# Patient Record
Sex: Male | Born: 1990 | Race: White | Hispanic: No | Marital: Single | Smoking: Current every day smoker
Health system: Southern US, Community
[De-identification: ages and names within clinical notes are randomized; demographics above are authoritative.]

---
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
## 2014-07-13 ENCOUNTER — Emergency Department: Payer: Self-pay | Admitting: Emergency Medicine

## 2014-11-07 ENCOUNTER — Emergency Department: Payer: Self-pay | Admitting: Student

## 2014-11-21 ENCOUNTER — Emergency Department
Admit: 2014-11-21 | Disposition: A | Discharge status: Home / Self Care | Payer: Self-pay | Admitting: Emergency Medicine

## 2014-11-21 LAB — COMPREHENSIVE METABOLIC PANEL
ALT: 16 U/L — AB
Albumin: 4.6 g/dL
Alkaline Phosphatase: 67 U/L
Anion Gap: 8 (ref 7–16)
BUN: 16 mg/dL
Bilirubin,Total: 0.5 mg/dL
CALCIUM: 9.3 mg/dL
Chloride: 105 mmol/L
Co2: 27 mmol/L
Creatinine: 1.11 mg/dL
EGFR (African American): >60
Glucose: 114 mg/dL — ABNORMAL HIGH
Potassium: 3.4 mmol/L — ABNORMAL LOW
SGOT(AST): 22 U/L
SODIUM: 140 mmol/L
TOTAL PROTEIN: 8 g/dL

## 2014-11-21 LAB — URINALYSIS, COMPLETE
BLOOD: NEGATIVE
Bacteria: NONE SEEN
Bilirubin,UR: NEGATIVE
Glucose,UR: NEGATIVE mg/dL (ref 0–75)
Hyaline Cast: 2
KETONE: NEGATIVE
Leukocyte Esterase: NEGATIVE
NITRITE: NEGATIVE
Ph: 5 (ref 4.5–8.0)
RBC,UR: 1 /HPF (ref 0–5)
Specific Gravity: 1.03 (ref 1.003–1.030)
Squamous Epithelial: NONE SEEN

## 2014-11-21 LAB — CBC
HCT: 47.1 % (ref 40.0–52.0)
HGB: 15.9 g/dL (ref 13.0–18.0)
MCH: 30 pg (ref 26.0–34.0)
MCHC: 33.7 g/dL (ref 32.0–36.0)
MCV: 89 fL (ref 80–100)
Platelet: 168 10*3/uL (ref 150–440)
RBC: 5.3 10*6/uL (ref 4.40–5.90)
RDW: 13.2 % (ref 11.5–14.5)
WBC: 10.5 10*3/uL (ref 3.8–10.6)

## 2014-11-21 LAB — DRUG SCREEN, URINE
Amphetamines, Ur Screen: NEGATIVE
Barbiturates, Ur Screen: NEGATIVE
Benzodiazepine, Ur Scrn: NEGATIVE
Cannabinoid 50 Ng, Ur ~~LOC~~: NEGATIVE
Cocaine Metabolite,Ur ~~LOC~~: NEGATIVE
MDMA (Ecstasy)Ur Screen: NEGATIVE
Methadone, Ur Screen: NEGATIVE
OPIATE, UR SCREEN: POSITIVE
Phencyclidine (PCP) Ur S: NEGATIVE
TRICYCLIC, UR SCREEN: NEGATIVE

## 2014-11-21 LAB — ACETAMINOPHEN LEVEL: Acetaminophen: <10 ug/mL

## 2014-11-21 LAB — SALICYLATE LEVEL: Salicylates, Serum: <4 mg/dL

## 2014-11-21 LAB — ETHANOL: Ethanol: <5 mg/dL

## 2015-01-12 ENCOUNTER — Emergency Department
Admission: EM | Admit: 2015-01-12 | Discharge: 2015-01-12 | Disposition: A | Discharge status: Home / Self Care | Payer: PRIVATE HEALTH INSURANCE | Attending: Emergency Medicine | Admitting: Emergency Medicine

## 2015-01-12 ENCOUNTER — Emergency Department: Payer: PRIVATE HEALTH INSURANCE

## 2015-01-12 ENCOUNTER — Encounter: Payer: Self-pay | Admitting: Emergency Medicine

## 2015-01-12 DIAGNOSIS — Y9389 Activity, other specified: Secondary | ICD-10-CM | POA: Diagnosis not present

## 2015-01-12 DIAGNOSIS — W228XXA Striking against or struck by other objects, initial encounter: Secondary | ICD-10-CM | POA: Diagnosis not present

## 2015-01-12 DIAGNOSIS — Y9289 Other specified places as the place of occurrence of the external cause: Secondary | ICD-10-CM | POA: Diagnosis not present

## 2015-01-12 DIAGNOSIS — S92421A Displaced fracture of distal phalanx of right great toe, initial encounter for closed fracture: Secondary | ICD-10-CM | POA: Insufficient documentation

## 2015-01-12 DIAGNOSIS — Y998 Other external cause status: Secondary | ICD-10-CM | POA: Insufficient documentation

## 2015-01-12 DIAGNOSIS — R269 Unspecified abnormalities of gait and mobility: Secondary | ICD-10-CM | POA: Diagnosis not present

## 2015-01-12 DIAGNOSIS — S92401A Displaced unspecified fracture of right great toe, initial encounter for closed fracture: Secondary | ICD-10-CM

## 2015-01-12 DIAGNOSIS — S90111A Contusion of right great toe without damage to nail, initial encounter: Secondary | ICD-10-CM | POA: Insufficient documentation

## 2015-01-12 DIAGNOSIS — S99922A Unspecified injury of left foot, initial encounter: Secondary | ICD-10-CM | POA: Diagnosis present

## 2015-01-12 MED ORDER — HYDROCODONE-ACETAMINOPHEN 5-325 MG PO TABS
1.0000 | ORAL_TABLET | ORAL | Status: AC
  Administered 2015-01-12: 1 via ORAL

## 2015-01-12 MED ORDER — TRAMADOL HCL 50 MG PO TABS: 50.0000 mg | ORAL_TABLET | Freq: Four times a day (QID) | ORAL | Status: DC | PRN

## 2015-01-12 MED ORDER — HYDROCODONE-ACETAMINOPHEN 5-325 MG PO TABS
ORAL_TABLET | ORAL | Status: AC
  Filled 2015-01-12: qty 1

## 2015-01-12 MED ORDER — NAPROXEN 500 MG PO TABS: 500.0000 mg | ORAL_TABLET | Freq: Two times a day (BID) | ORAL | Status: AC

## 2015-01-12 NOTE — ED Provider Notes (Signed)
CSN: [id number]     Arrival date & time 01/12/15  1915 History   First MD Initiated Contact with Patient 01/12/15 1924     Chief Complaint  Patient presents with  . Foot Injury    was kicking at something hit metal post, approx 4 hours ago     (Consider location/radiation/quality/duration/timing/severity/associated sxs/prior Treatment) HPI  24 year old male presents to the emergency department for injury to right great toe. Approximately 4 hours ago patient kicked a metal bed frame accidentally. Patient was wearing shoes. He had mild to moderate pain to the right great toe. Pain has progressively gotten worse. He is able to ambulate but with antalgic component. He denies any ankle or knee pain. Pain is described as a throbbing sensation that has improved with elevation.   History reviewed. No pertinent past medical history. No past surgical history on file. No family history on file. History  Substance Use Topics  . Smoking status: Not on file  . Smokeless tobacco: Not on file  . Alcohol Use: Not on file    Review of Systems  Constitutional: Negative.  Negative for fever, chills, activity change and appetite change.  HENT: Negative for congestion, ear pain, mouth sores, rhinorrhea, sinus pressure, sore throat and trouble swallowing.   Eyes: Negative for photophobia, pain and discharge.  Respiratory: Negative for cough, chest tightness and shortness of breath.   Cardiovascular: Negative for chest pain and leg swelling.  Gastrointestinal: Negative for nausea, vomiting, abdominal pain, diarrhea and abdominal distention.  Genitourinary: Negative for dysuria and difficulty urinating.  Musculoskeletal: Positive for joint swelling, arthralgias and gait problem. Negative for back pain.  Skin: Negative for color change and rash.  Neurological: Negative for dizziness and headaches.  Hematological: Negative for adenopathy.  Psychiatric/Behavioral: Negative for behavioral problems and  agitation.      Allergies  Review of patient's allergies indicates no known allergies.  Home Medications   Prior to Admission medications   Medication Sig Start Date End Date Taking? Authorizing Provider  naproxen (NAPROSYN) 500 MG tablet Take 1 tablet (500 mg total) by mouth 2 (two) times daily with a meal. 01/12/15   Evon Slackhomas C Gaines, PA-C  traMADol (ULTRAM) 50 MG tablet Take 1 tablet (50 mg total) by mouth every 6 (six) hours as needed. 01/12/15   Evon Slackhomas C Gaines, PA-C   BP 115/64 mmHg  Pulse 100  Temp(Src) 98.5 F (36.9 C) (Oral)  Resp 20  Ht 6' (1.829 m)  Wt 153 lb (69.4 kg)  BMI 20.75 kg/m2  SpO2 99% Physical Exam  Constitutional: He is oriented to person, place, and time. He appears well-developed and well-nourished.  HENT:  Head: Normocephalic and atraumatic.  Eyes: Conjunctivae and EOM are normal.  Neck: Normal range of motion. Neck supple.  Cardiovascular: Normal rate.   Pulmonary/Chest: Effort normal and breath sounds normal. No respiratory distress.  Musculoskeletal:       Right foot: There is tenderness, bony tenderness and swelling. There is normal range of motion, normal capillary refill, no deformity and no laceration.       Feet:  Neurological: He is alert and oriented to person, place, and time.  Skin: Skin is warm and dry.  Psychiatric: He has a normal mood and affect. His behavior is normal. Judgment and thought content normal.    ED Course  Procedures (including critical care time) Labs Review Labs Reviewed - No data to display  Imaging Review Dg Foot Complete Right  01/12/2015   CLINICAL DATA:  Trauma/  injury to 1st digit  EXAM: RIGHT FOOT COMPLETE - 3+ VIEW  COMPARISON:  None.  FINDINGS: Comminuted fracture involving the medial base of the 1st distal phalanx. Associated intra-articular extension.  The joint spaces are preserved.  Mild soft tissue swelling.  IMPRESSION: Comminuted intra-articular fracture involving the 1st distal phalanx, as above.    Electronically Signed   By: Charline Bills M.D.   On: 01/12/2015 19:53     EKG Interpretation None      MDM   Final diagnoses:  Contusion of right great toe without damage to nail, initial encounter  Fractured great toe, right, closed, initial encounter    Patient was given postop shoe, crutches, prescription for naproxen, tramadol. Follow up with orthopedics in 5-7 days. Stay nonweightbearing.    Evon Slack, PA-C 01/12/15 2001  Sharman Cheek, MD 01/12/15 [id number]

## 2015-01-12 NOTE — Discharge Instructions (Signed)
Blunt Trauma °You have been evaluated for injuries. You have been examined and your caregiver has not found injuries serious enough to require hospitalization. °It is common to have multiple bruises and sore muscles following an accident. These tend to feel worse for the first 24 hours. You will feel more stiffness and soreness over the next several hours and worse when you wake up the first morning after your accident. After this point, you should begin to improve with each passing day. The amount of improvement depends on the amount of damage done in the accident. °Following your accident, if some part of your body does not work as it should, or if the pain in any area continues to increase, you should return to the Emergency Department for re-evaluation.  °HOME CARE INSTRUCTIONS  °Routine care for sore areas should include: °· Ice to sore areas every 2 hours for 20 minutes while awake for the next 2 days. °· Drink extra fluids (not alcohol). °· Take a hot or warm shower or bath once or twice a day to increase blood flow to sore muscles. This will help you "limber up". °· Activity as tolerated. Lifting may aggravate neck or back pain. °· Only take over-the-counter or prescription medicines for pain, discomfort, or fever as directed by your caregiver. Do not use aspirin. This may increase bruising or increase bleeding if there are small areas where this is happening. °SEEK IMMEDIATE MEDICAL CARE IF: °· Numbness, tingling, weakness, or problem with the use of your arms or legs. °· A severe headache is not relieved with medications. °· There is a change in bowel or bladder control. °· Increasing pain in any areas of the body. °· Short of breath or dizzy. °· Nauseated, vomiting, or sweating. °· Increasing belly (abdominal) discomfort. °· Blood in urine, stool, or vomiting blood. °· Pain in either shoulder in an area where a shoulder strap would be. °· Feelings of lightheadedness or if you have a fainting  episode. °Sometimes it is not possible to identify all injuries immediately after the trauma. It is important that you continue to monitor your condition after the emergency department visit. If you feel you are not improving, or improving more slowly than should be expected, call your physician. If you feel your symptoms (problems) are worsening, return to the Emergency Department immediately. °Document Released: 04/29/2001 Document Revised: 10/26/2011 Document Reviewed: 03/21/2008 °ExitCare® Patient Information ©2015 [employer]. This information is not intended to replace advice given to you by your health care provider. Make sure you discuss any questions you have with your health care provider. ° °Cryotherapy °Cryotherapy means treatment with cold. Ice or gel packs can be used to reduce both pain and swelling. Ice is the most helpful within the first 24 to 48 hours after an injury or flare-up from overusing a muscle or joint. Sprains, strains, spasms, burning pain, shooting pain, and aches can all be eased with ice. Ice can also be used when recovering from surgery. Ice is effective, has very few side effects, and is safe for most people to use. °PRECAUTIONS  °Ice is not a safe treatment option for people with: °· Raynaud phenomenon. This is a condition affecting small blood vessels in the extremities. Exposure to cold may cause your problems to return. °· Cold hypersensitivity. There are many forms of cold hypersensitivity, including: °¨ Cold urticaria. Red, itchy hives appear on the skin when the tissues begin to warm after being iced. °¨ Cold erythema. This is a red, itchy rash   caused by exposure to cold. °¨ Cold hemoglobinuria. Red blood cells break down when the tissues begin to warm after being iced. The hemoglobin that carry oxygen are passed into the urine because they cannot combine with blood proteins fast enough. °· Numbness or altered sensitivity in the area being iced. °If you have any of the  following conditions, do not use ice until you have discussed cryotherapy with your caregiver: °· Heart conditions, such as arrhythmia, angina, or chronic heart disease. °· High blood pressure. °· Healing wounds or open skin in the area being iced. °· Current infections. °· Rheumatoid arthritis. °· Poor circulation. °· Diabetes. °Ice slows the blood flow in the region it is applied. This is beneficial when trying to stop inflamed tissues from spreading irritating chemicals to surrounding tissues. However, if you expose your skin to cold temperatures for too long or without the proper protection, you can damage your skin or nerves. Watch for signs of skin damage due to cold. °HOME CARE INSTRUCTIONS °Follow these tips to use ice and cold packs safely. °· Place a dry or damp towel between the ice and skin. A damp towel will cool the skin more quickly, so you may need to shorten the time that the ice is used. °· For a more rapid response, add gentle compression to the ice. °· Ice for no more than 10 to 20 minutes at a time. The bonier the area you are icing, the less time it will take to get the benefits of ice. °· Check your skin after 5 minutes to make sure there are no signs of a poor response to cold or skin damage. °· Rest 20 minutes or more between uses. °· Once your skin is numb, you can end your treatment. You can test numbness by very lightly touching your skin. The touch should be so light that you do not see the skin dimple from the pressure of your fingertip. When using ice, most people will feel these normal sensations in this order: cold, burning, aching, and numbness. °· Do not use ice on someone who cannot communicate their responses to pain, such as small children or people with dementia. °HOW TO MAKE AN ICE PACK °Ice packs are the most common way to use ice therapy. Other methods include ice massage, ice baths, and cryosprays. Muscle creams that cause a cold, tingly feeling do not offer the same  benefits that ice offers and should not be used as a substitute unless recommended by your caregiver. °To make an ice pack, do one of the following: °· Place crushed ice or a bag of frozen vegetables in a sealable plastic bag. Squeeze out the excess air. Place this bag inside another plastic bag. Slide the bag into a pillowcase or place a damp towel between your skin and the bag. °· Mix 3 parts water with 1 part rubbing alcohol. Freeze the mixture in a sealable plastic bag. When you remove the mixture from the freezer, it will be slushy. Squeeze out the excess air. Place this bag inside another plastic bag. Slide the bag into a pillowcase or place a damp towel between your skin and the bag. °SEEK MEDICAL CARE IF: °· You develop white spots on your skin. This may give the skin a blotchy (mottled) appearance. °· Your skin turns blue or pale. °· Your skin becomes waxy or hard. °· Your swelling gets worse. °MAKE SURE YOU:  °· Understand these instructions. °· Will watch your condition. °· Will get help right   away if you are not doing well or get worse. °Document Released: 03/30/2011 Document Revised: 12/18/2013 Document Reviewed: 03/30/2011 °ExitCare® Patient Information ©2015 [employer]. This information is not intended to replace advice given to you by your health care provider. Make sure you discuss any questions you have with your health care provider. ° °

## 2015-01-12 NOTE — ED Notes (Signed)
Was kicking at shoe, hit metal piece of bed, foot painful

## 2015-03-24 ENCOUNTER — Emergency Department: Payer: PRIVATE HEALTH INSURANCE

## 2015-03-24 ENCOUNTER — Encounter: Payer: Self-pay | Admitting: Emergency Medicine

## 2015-03-24 ENCOUNTER — Emergency Department
Admission: EM | Admit: 2015-03-24 | Discharge: 2015-03-24 | Disposition: A | Discharge status: Home / Self Care | Payer: PRIVATE HEALTH INSURANCE | Attending: Emergency Medicine | Admitting: Emergency Medicine

## 2015-03-24 DIAGNOSIS — S60921A Unspecified superficial injury of right hand, initial encounter: Secondary | ICD-10-CM | POA: Diagnosis present

## 2015-03-24 DIAGNOSIS — S62316A Displaced fracture of base of fifth metacarpal bone, right hand, initial encounter for closed fracture: Secondary | ICD-10-CM | POA: Insufficient documentation

## 2015-03-24 DIAGNOSIS — Y9289 Other specified places as the place of occurrence of the external cause: Secondary | ICD-10-CM | POA: Insufficient documentation

## 2015-03-24 DIAGNOSIS — Y9389 Activity, other specified: Secondary | ICD-10-CM | POA: Insufficient documentation

## 2015-03-24 DIAGNOSIS — Z791 Long term (current) use of non-steroidal anti-inflammatories (NSAID): Secondary | ICD-10-CM | POA: Insufficient documentation

## 2015-03-24 DIAGNOSIS — S62319A Displaced fracture of base of unspecified metacarpal bone, initial encounter for closed fracture: Secondary | ICD-10-CM

## 2015-03-24 DIAGNOSIS — W2209XA Striking against other stationary object, initial encounter: Secondary | ICD-10-CM | POA: Insufficient documentation

## 2015-03-24 DIAGNOSIS — Y998 Other external cause status: Secondary | ICD-10-CM | POA: Insufficient documentation

## 2015-03-24 MED ORDER — KETOROLAC TROMETHAMINE 10 MG PO TABS: 10.0000 mg | ORAL_TABLET | Freq: Three times a day (TID) | ORAL | Status: AC

## 2015-03-24 MED ORDER — TRAMADOL HCL 50 MG PO TABS: 50.0000 mg | ORAL_TABLET | Freq: Two times a day (BID) | ORAL | Status: AC

## 2015-03-24 NOTE — ED Notes (Signed)
Pt reports trying to catch something falling off an entertainment stand last night and now having pain to right hand. Some light bruising and swelling noted to top of right hand.

## 2015-03-24 NOTE — ED Provider Notes (Signed)
[facility] Emergency Department Provider Note ____________________________________________  Time seen: 0830  I have reviewed the triage vital signs and the nursing notes.  HISTORY  Chief Complaint  Hand Injury  HPI Joshua Doyle is a 24 y.o. male with swelling and disability to the lateral right hand after an injury last night.He apparently was moving a large armoire and a large glass vase, when the vase began to fall. He instinctively reached to catch the vase and may have hit his partially-closed hand on the corner of the armoire. He felt a pop, but denies immediate pain or swelling. He awoke today with bruising, swelling, pain, and disability to the right (dominant) hand.   History reviewed. No pertinent past medical history.  There are no active problems to display for this patient.  History reviewed. No pertinent past surgical history.  Current Outpatient Rx  Name  Route  Sig  Dispense  Refill  . ketorolac (TORADOL) 10 MG tablet   Oral   Take 1 tablet (10 mg total) by mouth every 8 (eight) hours.   15 tablet   0   . naproxen (NAPROSYN) 500 MG tablet   Oral   Take 1 tablet (500 mg total) by mouth 2 (two) times daily with a meal.   30 tablet   0   . traMADol (ULTRAM) 50 MG tablet   Oral   Take 1 tablet (50 mg total) by mouth 2 (two) times daily.   15 tablet   0    Allergies Review of patient's allergies indicates no known allergies.  No family history on file.  Social History History  Substance Use Topics  . Smoking status: Never Smoker   . Smokeless tobacco: Not on file  . Alcohol Use: No   Review of Systems  Constitutional: Negative for fever. Eyes: Negative for visual changes. ENT: Negative for sore throat. Cardiovascular: Negative for chest pain. Respiratory: Negative for shortness of breath. Gastrointestinal: Negative for abdominal pain, vomiting and diarrhea. Genitourinary: Negative for dysuria. Musculoskeletal: Negative  for back pain. Right hand pain Skin: Negative for rash. Neurological: Negative for headaches, focal weakness or numbness. ____________________________________________  PHYSICAL EXAM:  VITAL SIGNS: ED Triage Vitals  Enc Vitals Group     BP 03/24/15 0813 126/68 mmHg     Pulse Rate 03/24/15 0813 87     Resp --      Temp 03/24/15 0813 98.7 F (37.1 C)     Temp Source 03/24/15 0813 Oral     SpO2 03/24/15 0813 98 %     Weight --      Height --      Head Cir --      Peak Flow --      Pain Score 03/24/15 0814 8     Pain Loc --      Pain Edu? --      Excl. in GC? --    Constitutional: Alert and oriented. Well appearing and in no distress. Eyes: Conjunctivae are normal. PERRL. Normal extraocular movements. ENT   Head: Normocephalic and atraumatic.   Nose: No congestion/rhinnorhea.   Mouth/Throat: Mucous membranes are moist.   Neck: Supple. No thyromegaly. Hematological/Lymphatic/Immunilogical: No cervical lymphadenopathy. Cardiovascular: Normal rate, regular rhythm. Normal distal pulses Respiratory: Normal respiratory effort. No wheezes/rales/rhonchi. Gastrointestinal: Soft and nontender. No distention. Musculoskeletal: Nontender with normal range of motion in all extremities, except right hand due to pain. Dorsolateral swelling and bruising noted to the lateral right hand.  Neurologic:  Normal gross sensation.  Normal gait without ataxia. Normal speech and language. No gross focal neurologic deficits are appreciated. Skin:  Skin is warm, dry and intact. No rash noted. Psychiatric: Mood and affect are normal. Patient exhibits appropriate insight and judgment. ____________________________________________   RADIOLOGY  Right Hand IMPRESSION: Fifth metacarpal fracture  I, Johntavius Shepard, Charlesetta Ivory, personally viewed and evaluated these images as part of my medical decision making.  ____________________________________________  PROCEDURES  Right hand Ulnar Gutter  OCL ____________________________________________  INITIAL IMPRESSION / ASSESSMENT AND PLAN / ED COURSE  Radiology results to patient. Initial fracture care of closed right 5th MC fracture. Follow-up with Dr. Martha Clan for follow-up fracture management. Prescriptions for Toradol and Ultram provided.  ____________________________________________  FINAL CLINICAL IMPRESSION(S) / ED DIAGNOSES  Final diagnoses:  Fracture of metacarpal base of right hand, closed, initial encounter     Lissa Hoard, PA-C 03/24/15 0915  Jene Every, MD 03/24/15 1400

## 2015-03-24 NOTE — Discharge Instructions (Signed)
Boxer's Fracture °You have a break (fracture) of the fifth metacarpal bone. This is commonly called a boxer's fracture. This is the bone in the hand where the little finger attaches. The fracture is in the end of that bone, closest to the little finger. It is usually caused when you hit an object with a clenched fist. Often, the knuckle is pushed down by the impact. Sometimes, the fracture rotates out of position. A boxer's fracture will usually heal within 6 weeks, if it is treated properly and protected from re-injury. Surgery is sometimes needed. °A cast, splint, or bulky hand dressing may be used to protect and immobilize a boxer's fracture. Do not remove this device or dressing until your caregiver approves. Keep your hand elevated, and apply ice packs for 15-20 minutes every 2 hours, for the first 2 days. Elevation and ice help reduce swelling and relieve pain. See your caregiver, or an orthopedic specialist, for follow-up care within the next 10 days. This is to make sure your fracture is healing properly. °Document Released: 08/03/2005 Document Revised: 10/26/2011 Document Reviewed: 01/21/2007 °ExitCare® Patient Information ©2015 [employer]. This information is not intended to replace advice given to you by your health care provider. Make sure you discuss any questions you have with your health care provider. ° °Cast or Splint Care °Casts and splints support injured limbs and keep bones from moving while they heal. It is important to care for your cast or splint at home.   °HOME CARE INSTRUCTIONS °· Keep the cast or splint uncovered during the drying period. It can take 24 to 48 hours to dry if it is made of plaster. A fiberglass cast will dry in less than 1 hour. °· Do not rest the cast on anything harder than a pillow for the first 24 hours. °· Do not put weight on your injured limb or apply pressure to the cast until your health care provider gives you permission. °· Keep the cast or splint dry. Wet  casts or splints can lose their shape and may not support the limb as well. A wet cast that has lost its shape can also create harmful pressure on your skin when it dries. Also, wet skin can become infected. °¨ Cover the cast or splint with a plastic bag when bathing or when out in the rain or snow. If the cast is on the trunk of the body, take sponge baths until the cast is removed. °¨ If your cast does become wet, dry it with a towel or a blow dryer on the cool setting only. °· Keep your cast or splint clean. Soiled casts may be wiped with a moistened cloth. °· Do not place any hard or soft foreign objects under your cast or splint, such as cotton, toilet paper, lotion, or powder. °· Do not try to scratch the skin under the cast with any object. The object could get stuck inside the cast. Also, scratching could lead to an infection. If itching is a problem, use a blow dryer on a cool setting to relieve discomfort. °· Do not trim or cut your cast or remove padding from inside of it. °· Exercise all joints next to the injury that are not immobilized by the cast or splint. For example, if you have a long leg cast, exercise the hip joint and toes. If you have an arm cast or splint, exercise the shoulder, elbow, thumb, and fingers. °· Elevate your injured arm or leg on 1 or 2 pillows for the   first 1 to 3 days to decrease swelling and pain.It is best if you can comfortably elevate your cast so it is higher than your heart. SEEK MEDICAL CARE IF:   Your cast or splint cracks.  Your cast or splint is too tight or too loose.  You have unbearable itching inside the cast.  Your cast becomes wet or develops a soft spot or area.  You have a bad smell coming from inside your cast.  You get an object stuck under your cast.  Your skin around the cast becomes red or raw.  You have new pain or worsening pain after the cast has been applied. SEEK IMMEDIATE MEDICAL CARE IF:   You have fluid leaking through the  cast.  You are unable to move your fingers or toes.  You have discolored (blue or white), cool, painful, or very swollen fingers or toes beyond the cast.  You have tingling or numbness around the injured area.  You have severe pain or pressure under the cast.  You have any difficulty with your breathing or have shortness of breath.  You have chest pain. Document Released: 07/31/2000 Document Revised: 05/24/2013 Document Reviewed: 02/09/2013 [facility] Patient Information 2015 [place], [place]. This information is not intended to replace advice given to you by your health care provider. Make sure you discuss any questions you have with your health care provider.  Call Dr. Martha Clan for follow-up fracture care. Keep the splint in place, clean, and dry, until cleared by ortho. Take the prescription pain medicines as needed. Apply ice through the splint to reduce swelling and pain.

## 2015-03-25 ENCOUNTER — Encounter: Payer: Self-pay | Admitting: Emergency Medicine

## 2015-03-25 ENCOUNTER — Emergency Department
Admission: EM | Admit: 2015-03-25 | Discharge: 2015-03-25 | Disposition: A | Discharge status: Home / Self Care | Payer: PRIVATE HEALTH INSURANCE | Attending: Emergency Medicine | Admitting: Emergency Medicine

## 2015-03-25 DIAGNOSIS — X58XXXD Exposure to other specified factors, subsequent encounter: Secondary | ICD-10-CM | POA: Diagnosis not present

## 2015-03-25 DIAGNOSIS — M79621 Pain in right upper arm: Secondary | ICD-10-CM | POA: Diagnosis present

## 2015-03-25 DIAGNOSIS — Z791 Long term (current) use of non-steroidal anti-inflammatories (NSAID): Secondary | ICD-10-CM | POA: Diagnosis not present

## 2015-03-25 DIAGNOSIS — S62306D Unspecified fracture of fifth metacarpal bone, right hand, subsequent encounter for fracture with routine healing: Secondary | ICD-10-CM | POA: Insufficient documentation

## 2015-03-25 NOTE — ED Notes (Addendum)
C/o right wrist pain. Broke wrist on Saturday and was seen here on Sunday morning.  Pain 7-8/10. Patient reports since the splint was cut off by MD here today, it feels a little bit better. Has not made an appointment with Ortho yet.

## 2015-03-25 NOTE — ED Provider Notes (Signed)
[facility] Emergency Department Provider Note  ____________________________________________  Time seen: On arrival  I have reviewed the triage vital signs and the nursing notes.   HISTORY  Chief Complaint Arm Pain    HPI Joshua Doyle is a 24 y.o. male who presents with complaints of right hand pain. Patient was seen yesterday and diagnosed with a fifth metacarpal fracture. Today at work he banged his hand and developed pain in the right hand. His boss sent him to the emergency department to get a note.  History reviewed. No pertinent past medical history.  There are no active problems to display for this patient.   History reviewed. No pertinent past surgical history.  Current Outpatient Rx  Name  Route  Sig  Dispense  Refill  . ketorolac (TORADOL) 10 MG tablet   Oral   Take 1 tablet (10 mg total) by mouth every 8 (eight) hours.   15 tablet   0   . naproxen (NAPROSYN) 500 MG tablet   Oral   Take 1 tablet (500 mg total) by mouth 2 (two) times daily with a meal.   30 tablet   0   . traMADol (ULTRAM) 50 MG tablet   Oral   Take 1 tablet (50 mg total) by mouth 2 (two) times daily.   15 tablet   0     Allergies Review of patient's allergies indicates no known allergies.  No family history on file.  Social History History  Substance Use Topics  . Smoking status: Never Smoker   . Smokeless tobacco: Not on file  . Alcohol Use: No    Review of Systems  Constitutional: Negative for fever.  Musculoskeletal: Positive for hand pain Skin: Negative for rash. Neurological: Negative for headaches or focal weakness   ____________________________________________   PHYSICAL EXAM:  VITAL SIGNS: ED Triage Vitals  Enc Vitals Group     BP 03/25/15 1630 115/74 mmHg     Pulse Rate 03/25/15 1630 84     Resp 03/25/15 1630 18     Temp 03/25/15 1630 98.5 F (36.9 C)     Temp Source 03/25/15 1630 Oral     SpO2 03/25/15 1630 95 %     Weight  03/25/15 1630 150 lb (68.04 kg)     Height 03/25/15 1630 6' (1.829 m)     Head Cir --      Peak Flow --      Pain Score 03/25/15 1630 8     Pain Loc --      Pain Edu? --      Excl. in GC? --      Constitutional: Alert and oriented. Well appearing and in no distress.  ENT   Head: Normocephalic and atraumatic.   Mouth/Throat: Mucous membranes are moist. Cardiovascular: Normal rate, regular rhythm.  Respiratory: Normal respiratory effort without tachypnea nor retractions.   Musculoskeletal: Patient with tenderness to palpation in the right medial hand consistent with his fifth metacarpal fracture. Normal cap refill. Minimal swelling Neurologic:  Normal speech and language. No gross focal neurologic deficits are appreciated. Skin:  Skin is warm, dry and intact. No rash noted. Psychiatric: Mood and affect are normal. Patient exhibits appropriate insight and judgment.  ____________________________________________    LABS (pertinent positives/negatives)  Labs Reviewed - No data to display  ____________________________________________     ____________________________________________    RADIOLOGY I have personally reviewed any xrays that were ordered on this patient: I reviewed yesterday's films  ____________________________________________  PROCEDURES  Procedure(s) performed: yes  New ulnar gutter splint applied by me using orthoglass. NV intact   ____________________________________________   INITIAL IMPRESSION / ASSESSMENT AND PLAN / ED COURSE  Pertinent labs & imaging results that were available during my care of the patient were reviewed by me and considered in my medical decision making (see chart for details).  Patient to follow-up with orthopedics. Unable to use his right hand until cleared by orthopedics. Note provided  ____________________________________________   FINAL CLINICAL IMPRESSION(S) / ED DIAGNOSES  Final diagnoses:  Fracture of  fifth metacarpal bone of right hand with routine healing     Jene Every, MD 03/25/15 1950

## 2015-03-25 NOTE — ED Notes (Addendum)
Pt states he had splint placed in ED yesterday for right wrist fx, states pain to right arm and wrist is getting worse, states he was sent home from work today due to pain, able to move fingers, cap refill <3 sec

## 2018-04-22 ENCOUNTER — Other Ambulatory Visit: Payer: Self-pay

## 2018-04-22 ENCOUNTER — Emergency Department: Payer: Managed Care, Other (non HMO)

## 2018-04-22 ENCOUNTER — Emergency Department
Admission: EM | Admit: 2018-04-22 | Discharge: 2018-04-22 | Disposition: A | Discharge status: Home / Self Care | Payer: Managed Care, Other (non HMO) | Attending: Emergency Medicine | Admitting: Emergency Medicine

## 2018-04-22 DIAGNOSIS — Y939 Activity, unspecified: Secondary | ICD-10-CM | POA: Insufficient documentation

## 2018-04-22 DIAGNOSIS — Z79899 Other long term (current) drug therapy: Secondary | ICD-10-CM | POA: Diagnosis not present

## 2018-04-22 DIAGNOSIS — Y999 Unspecified external cause status: Secondary | ICD-10-CM | POA: Insufficient documentation

## 2018-04-22 DIAGNOSIS — S99921A Unspecified injury of right foot, initial encounter: Secondary | ICD-10-CM | POA: Diagnosis present

## 2018-04-22 DIAGNOSIS — W228XXA Striking against or struck by other objects, initial encounter: Secondary | ICD-10-CM | POA: Insufficient documentation

## 2018-04-22 DIAGNOSIS — Y929 Unspecified place or not applicable: Secondary | ICD-10-CM | POA: Diagnosis not present

## 2018-04-22 DIAGNOSIS — F17200 Nicotine dependence, unspecified, uncomplicated: Secondary | ICD-10-CM | POA: Insufficient documentation

## 2018-04-22 DIAGNOSIS — S92514A Nondisplaced fracture of proximal phalanx of right lesser toe(s), initial encounter for closed fracture: Secondary | ICD-10-CM | POA: Diagnosis not present

## 2018-04-22 NOTE — ED Provider Notes (Signed)
[facility] Emergency Department Provider Note  ____________________________________________  Time seen: Approximately 6:18 PM  I have reviewed the triage vital signs and the nursing notes.   HISTORY  Chief Complaint Toe Injury    HPI Joshua Doyle is a 27 y.o. male that presents emergency department for evaluation of toe pain after stubbing his toes this morning.  Patient is concerned that his fifth toe is broken.  He has been using crutches throughout the day.  History reviewed. No pertinent past medical history.  There are no active problems to display for this patient.   History reviewed. No pertinent surgical history.  Prior to Admission medications   Medication Sig Start Date End Date Taking? Authorizing Provider  buprenorphine-naloxone (SUBOXONE) 8-2 mg SUBL SL tablet Place 1 tablet under the tongue daily.   Yes [provider]  ketorolac (TORADOL) 10 MG tablet Take 1 tablet (10 mg total) by mouth every 8 (eight) hours. 03/24/15   Menshew, Charlesetta Ivory, PA-C  naproxen (NAPROSYN) 500 MG tablet Take 1 tablet (500 mg total) by mouth 2 (two) times daily with a meal. 01/12/15   Evon Slack, PA-C  traMADol (ULTRAM) 50 MG tablet Take 1 tablet (50 mg total) by mouth 2 (two) times daily. 03/24/15   Menshew, Charlesetta Ivory, PA-C    Allergies Patient has no known allergies.  No family history on file.  Social History Social History   Tobacco Use  . Smoking status: Current Every Day Smoker  . Smokeless tobacco: Never Used  Substance Use Topics  . Alcohol use: No  . Drug use: Not Currently     Review of Systems  Respiratory: No SOB. Gastrointestinal: No abdominal pain.  No nausea, no vomiting.  Musculoskeletal: Positive for toe pain.  Skin: Negative for rash, abrasions, lacerations. Positive for ecchymosis.   ____________________________________________   PHYSICAL EXAM:  VITAL SIGNS: ED Triage Vitals  Enc Vitals Group     BP  04/22/18 1801 135/68     Pulse Rate 04/22/18 1801 93     Resp 04/22/18 1801 16     Temp 04/22/18 1801 99 F (37.2 C)     Temp Source 04/22/18 1801 Oral     SpO2 04/22/18 1801 96 %     Weight 04/22/18 1802 140 lb (63.5 kg)     Height 04/22/18 1802 6' (1.829 m)     Head Circumference --      Peak Flow --      Pain Score 04/22/18 1801 3     Pain Loc --      Pain Edu? --      Excl. in GC? --      Constitutional: Alert and oriented. Well appearing and in no acute distress. Eyes: Conjunctivae are normal. PERRL. EOMI. Head: Atraumatic. ENT:      Ears:      Nose: No congestion/rhinnorhea.      Mouth/Throat: Mucous membranes are moist.  Neck: No stridor.  Cardiovascular: Normal rate, regular rhythm.  Good peripheral circulation. Respiratory: Normal respiratory effort without tachypnea or retractions. Lungs CTAB. Good air entry to the bases with no decreased or absent breath sounds. Musculoskeletal: Full range of motion to all extremities. No gross deformities appreciated.  Mild ecchymosis to fifth right toe. Neurologic:  Normal speech and language. No gross focal neurologic deficits are appreciated.  Skin:  Skin is warm, dry and intact.  Psychiatric: Mood and affect are normal. Speech and behavior are normal. Patient exhibits appropriate insight  and judgement.   ____________________________________________   LABS (all labs ordered are listed, but only abnormal results are displayed)  Labs Reviewed - No data to display ____________________________________________  EKG   ____________________________________________  RADIOLOGY Lexine Baton, personally viewed and evaluated these images (plain radiographs) as part of my medical decision making, as well as reviewing the written report by the radiologist.  Dg Foot Complete Right  Result Date: 04/22/2018 CLINICAL DATA:  Pain at the fourth and fifth toes EXAM: RIGHT FOOT COMPLETE - 3+ VIEW COMPARISON:  01/12/2015 FINDINGS:  Suspected subtle acute intra-articular fracture base of the fifth proximal phalanx. No subluxation. No radiopaque foreign body IMPRESSION: Suspect acute subtle intra-articular nondisplaced fracture base of fifth proximal phalanx Electronically Signed   By: Jasmine Pang M.D.   On: 04/22/2018 18:54    ____________________________________________    PROCEDURES  Procedure(s) performed:    Procedures    Medications - No data to display   ____________________________________________   INITIAL IMPRESSION / ASSESSMENT AND PLAN / ED COURSE  Pertinent labs & imaging results that were available during my care of the patient were reviewed by me and considered in my medical decision making (see chart for details).  Review of the [place] CSRS was performed in accordance of the [facility] prior to dispensing any controlled drugs.     Patient's diagnosis is consistent with suspected fifth proximal phalanx fracture.  Vital signs and exam are reassuring.  X-ray consistent with possible fracture.  Toes were buddy taped.  Postop she was given.  Patient is to follow up with podiatry as directed. Patient is given ED precautions to return to the ED for any worsening or new symptoms.     ____________________________________________  FINAL CLINICAL IMPRESSION(S) / ED DIAGNOSES  Final diagnoses:  Closed nondisplaced fracture of proximal phalanx of lesser toe of right foot, initial encounter      NEW MEDICATIONS STARTED DURING THIS VISIT:  ED Discharge Orders    None          This chart was dictated using voice recognition software/Dragon. Despite best efforts to proofread, errors can occur which can change the meaning. Any change was purely unintentional.    Enid Derry, PA-C 04/22/18 2108    Pershing Proud Myra Rude, MD 04/23/18 [phone]

## 2018-04-22 NOTE — ED Triage Notes (Signed)
Pt states he got out of fast this morning and stumped his foot on the door frame injuring his right 3rd, 4th and 5th toe. Pt arrives with crutches.

## 2019-12-09 IMAGING — DX DG FOOT COMPLETE 3+V*R*
3 series · 3 of 3 positions shown · non-contrast
Comparison: 01/12/2015

CLINICAL DATA: Pain at the fourth and fifth toes

EXAM:
RIGHT FOOT COMPLETE - 3+ VIEW

[foot ap]
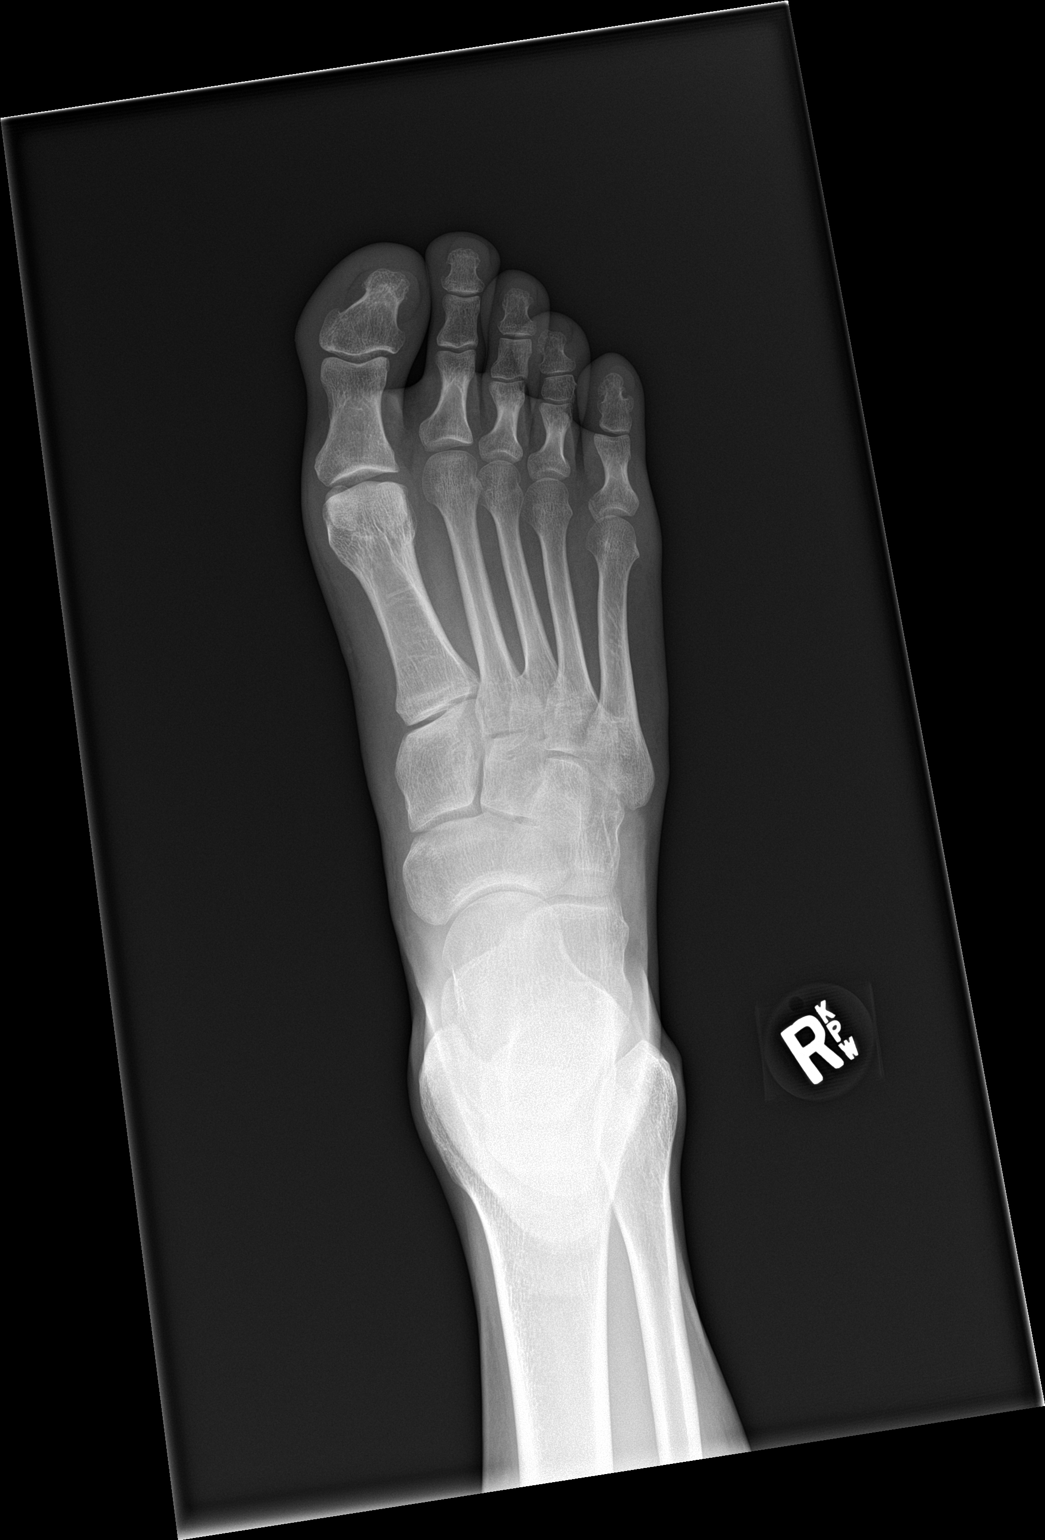

[foot obl]
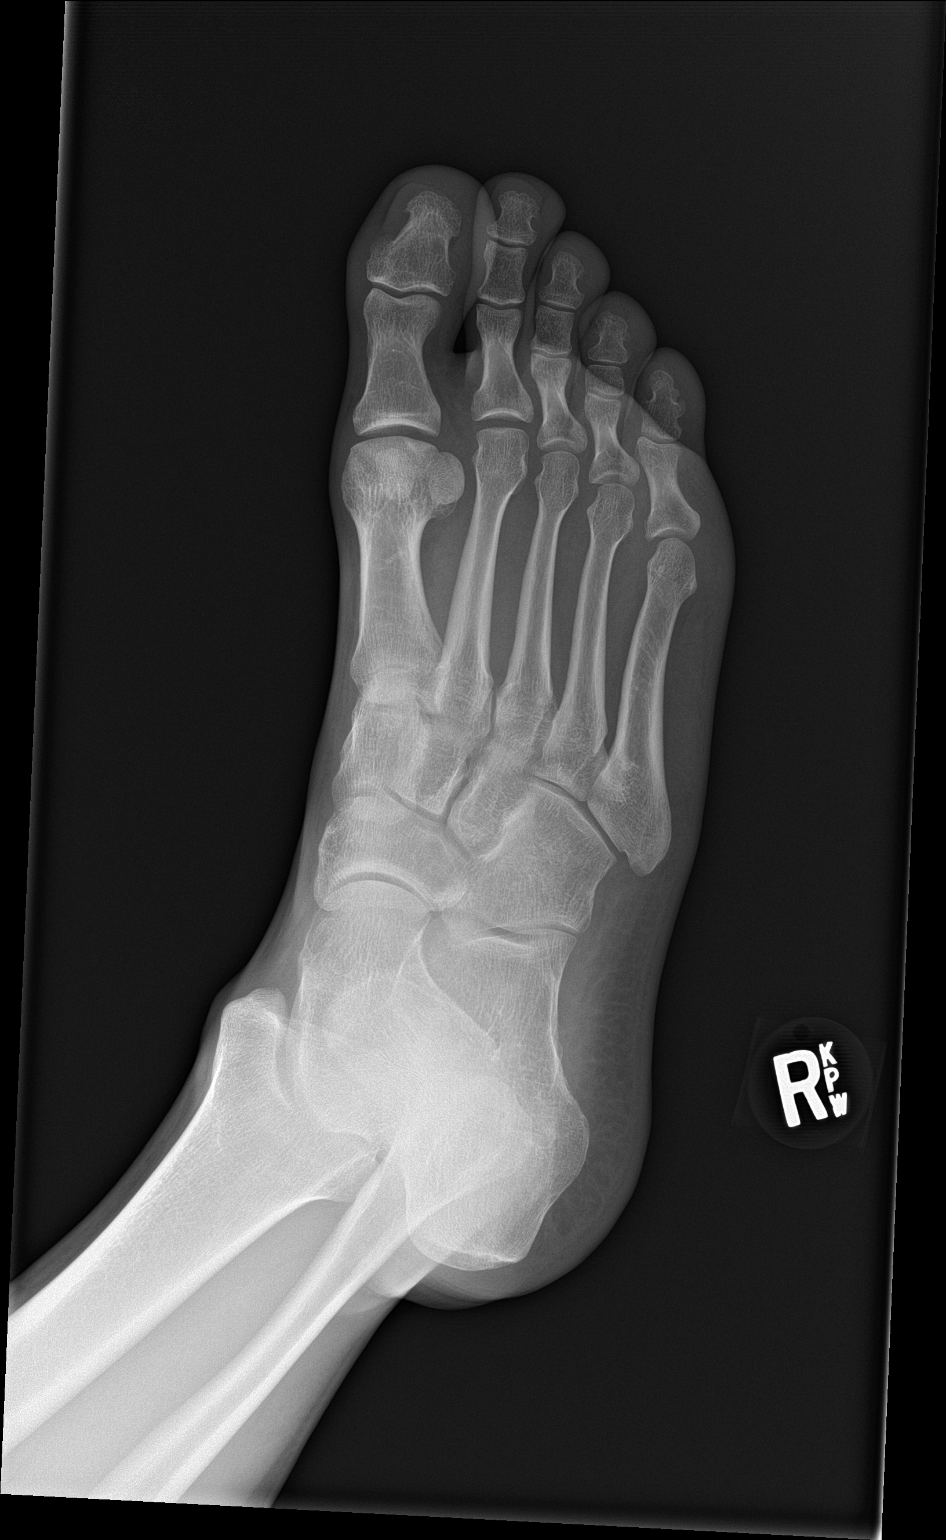

[foot lat]
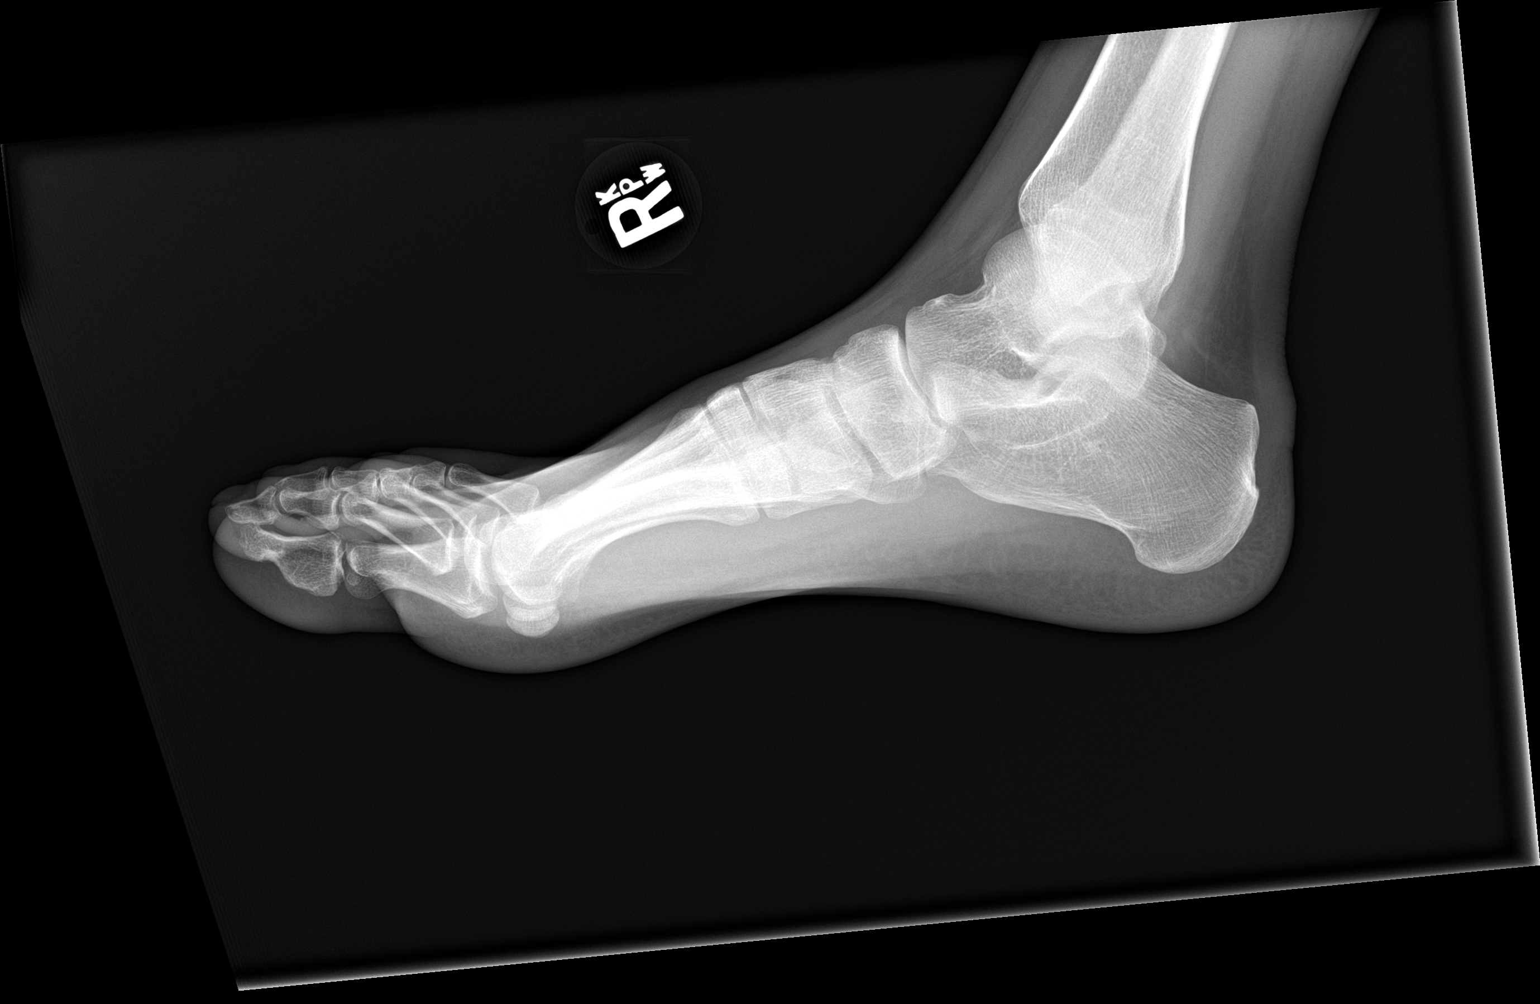

[3 of 3 positions shown; findings below may reference images not displayed]

FINDINGS: Suspected subtle acute intra-articular fracture base of the fifth
proximal phalanx. No subluxation. No radiopaque foreign body
IMPRESSION: Suspect acute subtle intra-articular nondisplaced fracture base of
fifth proximal phalanx

## 2021-04-04 ENCOUNTER — Other Ambulatory Visit: Payer: Self-pay

## 2021-04-04 ENCOUNTER — Encounter: Payer: Self-pay | Admitting: Emergency Medicine

## 2021-04-04 ENCOUNTER — Ambulatory Visit
Admission: EM | Admit: 2021-04-04 | Discharge: 2021-04-04 | Disposition: A | Discharge status: Home / Self Care | Payer: Managed Care, Other (non HMO) | Attending: Sports Medicine | Admitting: Sports Medicine

## 2021-04-04 ENCOUNTER — Ambulatory Visit (INDEPENDENT_AMBULATORY_CARE_PROVIDER_SITE_OTHER): Payer: Managed Care, Other (non HMO)

## 2021-04-04 DIAGNOSIS — S9031XA Contusion of right foot, initial encounter: Secondary | ICD-10-CM

## 2021-04-04 DIAGNOSIS — M79671 Pain in right foot: Secondary | ICD-10-CM | POA: Diagnosis not present

## 2021-04-04 DIAGNOSIS — S99921A Unspecified injury of right foot, initial encounter: Secondary | ICD-10-CM

## 2021-04-04 NOTE — ED Provider Notes (Signed)
[facility]    CSN: [id number] Arrival date & time: 04/04/21  1308      History   Chief Complaint Chief Complaint  Patient presents with   Foot Pain    right    HPI Joshua Doyle is a 30 y.o. male.   30 year old male who presents for evaluation of an injury to his right foot.  He reports that he injured it yesterday morning.  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  He had to work so he was walking around.  This is not a work-related injury and is not Worker's Comp.  He does have some crutches that he comes in using today.  He is noted some bruising.  Most of his pain is in the sole of his foot over the calcaneus.  He does not have a primary care physician.  He has just been doing supportive care.  He is concerned it might be broken so he comes in the urgent care for initial evaluation.  No medical care obtained up until now.  No other issues or problems are offered.   History reviewed. No pertinent past medical history.  There are no problems to display for this patient.   History reviewed. No pertinent surgical history.     Home Medications    Prior to Admission medications   Medication Sig Start Date End Date Taking? Authorizing Provider  buprenorphine-naloxone (SUBOXONE) 8-2 mg SUBL SL tablet Place 1 tablet under the tongue daily.   Yes [provider]  ketorolac (TORADOL) 10 MG tablet Take 1 tablet (10 mg total) by mouth every 8 (eight) hours. 03/24/15   Menshew, Charlesetta Ivory, PA-C  naproxen (NAPROSYN) 500 MG tablet Take 1 tablet (500 mg total) by mouth 2 (two) times daily with a meal. 01/12/15   Evon Slack, PA-C  traMADol (ULTRAM) 50 MG tablet Take 1 tablet (50 mg total) by mouth 2 (two) times daily. 03/24/15   Menshew, Charlesetta Ivory, PA-C    Family History History reviewed. No pertinent family history.  Social History Social History   Tobacco Use   Smoking status: Every Day    Types: Cigarettes    Smokeless tobacco: Never  Vaping Use   Vaping Use: Never used  Substance Use Topics   Alcohol use: No   Drug use: Not Currently     Allergies   Patient has no known allergies.   Review of Systems Review of Systems  Constitutional:  Positive for activity change. Negative for appetite change, chills, diaphoresis, fatigue and fever.  HENT:  Negative for congestion, ear pain, postnasal drip, rhinorrhea, sinus pressure, sinus pain, sneezing and sore throat.   Eyes:  Negative for pain.  Respiratory:  Negative for cough, chest tightness and shortness of breath.   Cardiovascular:  Negative for chest pain and palpitations.  Gastrointestinal:  Negative for abdominal pain, diarrhea, nausea and vomiting.  Genitourinary:  Negative for dysuria.  Musculoskeletal:  Positive for arthralgias and gait problem. Negative for back pain, myalgias and neck pain.  Skin:  Positive for color change. Negative for pallor, rash and wound.  Neurological:  Negative for dizziness, syncope, weakness, light-headedness, numbness and headaches.  All other systems reviewed and are negative.   Physical Exam Triage Vital Signs ED Triage Vitals  Enc Vitals Group     BP 04/04/21 1319 119/76     Pulse Rate 04/04/21 1319 (!) 106     Resp  04/04/21 1319 16     Temp 04/04/21 1319 98.7 F (37.1 C)     Temp Source 04/04/21 1319 Oral     SpO2 04/04/21 1319 95 %     Weight 04/04/21 1316 150 lb (68 kg)     Height 04/04/21 1316 6' (1.829 m)     Head Circumference --      Peak Flow --      Pain Score 04/04/21 1316 3     Pain Loc --      Pain Edu? --      Excl. in GC? --    No data found.  Updated Vital Signs BP 119/76 (BP Location: Right Arm)   Pulse (!) 106   Temp 98.7 F (37.1 C) (Oral)   Resp 16   Ht 6' (1.829 m)   Wt 68 kg   SpO2 95%   BMI 20.34 kg/m   Visual Acuity Right Eye Distance:   Left Eye Distance:   Bilateral Distance:    Right Eye Near:   Left Eye Near:    Bilateral Near:     Physical  Exam Vitals and nursing note reviewed.  Constitutional:      General: He is not in acute distress.    Appearance: Normal appearance. He is not ill-appearing, toxic-appearing or diaphoretic.  HENT:     Head: Normocephalic and atraumatic.     Nose: Nose normal.     Mouth/Throat:     Mouth: Mucous membranes are moist.  Eyes:     Conjunctiva/sclera: Conjunctivae normal.     Pupils: Pupils are equal, round, and reactive to light.  Cardiovascular:     Rate and Rhythm: Normal rate and regular rhythm.     Pulses: Normal pulses.     Heart sounds: Normal heart sounds. No murmur heard.   No friction rub. No gallop.  Pulmonary:     Effort: Pulmonary effort is normal.     Breath sounds: Normal breath sounds. No stridor. No wheezing, rhonchi or rales.  Musculoskeletal:        General: Tenderness and signs of injury present.     Cervical back: Normal range of motion and neck supple.     Right foot: Tenderness and bony tenderness present.     Left foot: Normal.     Comments: Patient does have some ecchymosis mostly in the medial aspect of the ankle just distal to the medial malleolus.  He is tender to palpation in that area but also over the calcaneus.  There is no midfoot instability.  He has no tenderness over the metatarsals or the toes.  There is no evidence of any tendon retraction.  Strength is well-preserved in all planes.  Skin:    General: Skin is warm and dry.     Capillary Refill: Capillary refill takes less than 2 seconds.  Neurological:     General: No focal deficit present.     Mental Status: He is alert and oriented to person, place, and time.     UC Treatments / Results  Labs (all labs ordered are listed, but only abnormal results are displayed) Labs Reviewed - No data to display  EKG   Radiology DG Foot Complete Right  Result Date: 04/04/2021 CLINICAL DATA:  Jumped off back of truck yesterday. Right foot injury and pain. Initial encounter. EXAM: RIGHT FOOT COMPLETE -  3+ VIEW COMPARISON:  04/22/2018 FINDINGS: There is no evidence of fracture or dislocation. There is no evidence of arthropathy or other  focal bone abnormality. Soft tissues are unremarkable. IMPRESSION: Negative. Electronically Signed   By: Danae Orleans M.D.   On: 04/04/2021 14:00    Procedures Procedures (including critical care time)  Medications Ordered in UC Medications - No data to display  Initial Impression / Assessment and Plan / UC Course  I have reviewed the triage vital signs and the nursing notes.  Pertinent labs & imaging results that were available during my care of the patient were reviewed by me and considered in my medical decision making (see chart for details).  Clinical impression: 1.  Injury to the right foot with pain over the sole of the foot at the calcaneus. 2.  Contusion to the right foot 3.  Traumatic ecchymosis  Treatment plan: 1.  The findings and treatment plan were discussed in detail with the patient.  Patient was in agreement. 2.  Recommended getting x-rays of left foot.  They were ordered and interpreted by myself here in the office today.  My independent review does not reveal any fracture.  It is consistent with a contusion.  We will wait for radiology overread and if there is anything that differs from my interpretation we will contact the patient. 3.  Ordered and dispensed a walking boot.  He can start to wean the crutches. 4.  Over-the-counter meds as needed 5.  Educational handouts provided. 6.  Indicated to him that the mainstay of treatment is going to be icing and elevation. 7.  Also given the name of a local podiatrist if his symptoms persist he can make an appointment since he does not have a PCP. 8.  He was stable upon discharge and will follow-up here as needed.    Final Clinical Impressions(s) / UC Diagnoses   Final diagnoses:  Foot pain, right  Contusion of right foot, initial encounter  Injury of right foot, initial encounter   Traumatic ecchymosis of right foot, initial encounter     Discharge Instructions      As we discussed, your x-ray did not show fracture.  You do have bruising and it is consistent with a foot contusion.  Icing and elevation is good to be the mainstay of treatment. I did give you a walking boot and you can start to bear weight as tolerated and wean off of the crutches. Over-the-counter meds such as Tylenol, ibuprofen, Motrin, Aleve, Advil as needed. I did give you the name of a foot doctor if your symptoms persist you can give them a call.     ED Prescriptions   None    PDMP not reviewed this encounter.   Delton See, MD 04/04/21 [phone]

## 2021-04-04 NOTE — Discharge Instructions (Addendum)
As we discussed, your x-ray did not show fracture.  You do have bruising and it is consistent with a foot contusion.  Icing and elevation is good to be the mainstay of treatment. I did give you a walking boot and you can start to bear weight as tolerated and wean off of the crutches. Over-the-counter meds such as Tylenol, ibuprofen, Motrin, Aleve, Advil as needed. I did give you the name of a foot doctor if your symptoms persist you can give them a call.

## 2021-04-04 NOTE — ED Triage Notes (Signed)
Patient states that he jumped off the back of his truck and hit his right foot on the pip of the truck yesterday morning.

## 2022-11-21 IMAGING — CR DG FOOT COMPLETE 3+V*R*
3 series · 3 of 3 positions shown · non-contrast
Comparison: 04/22/2018

CLINICAL DATA: Jumped off back of truck yesterday. Right foot
injury and pain. Initial encounter.

EXAM:
RIGHT FOOT COMPLETE - 3+ VIEW

[foot ap]
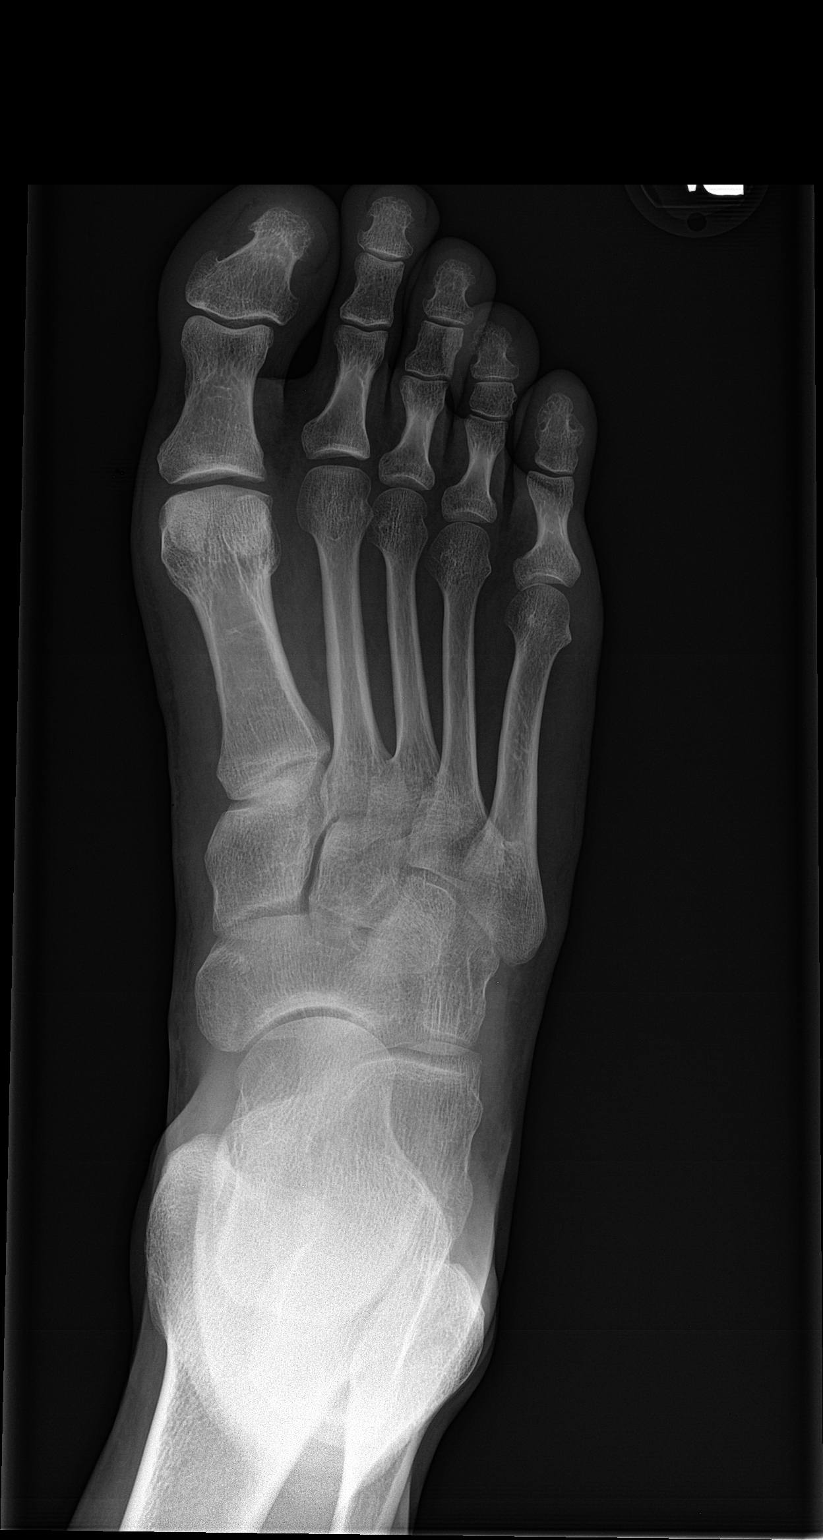

[foot obl]
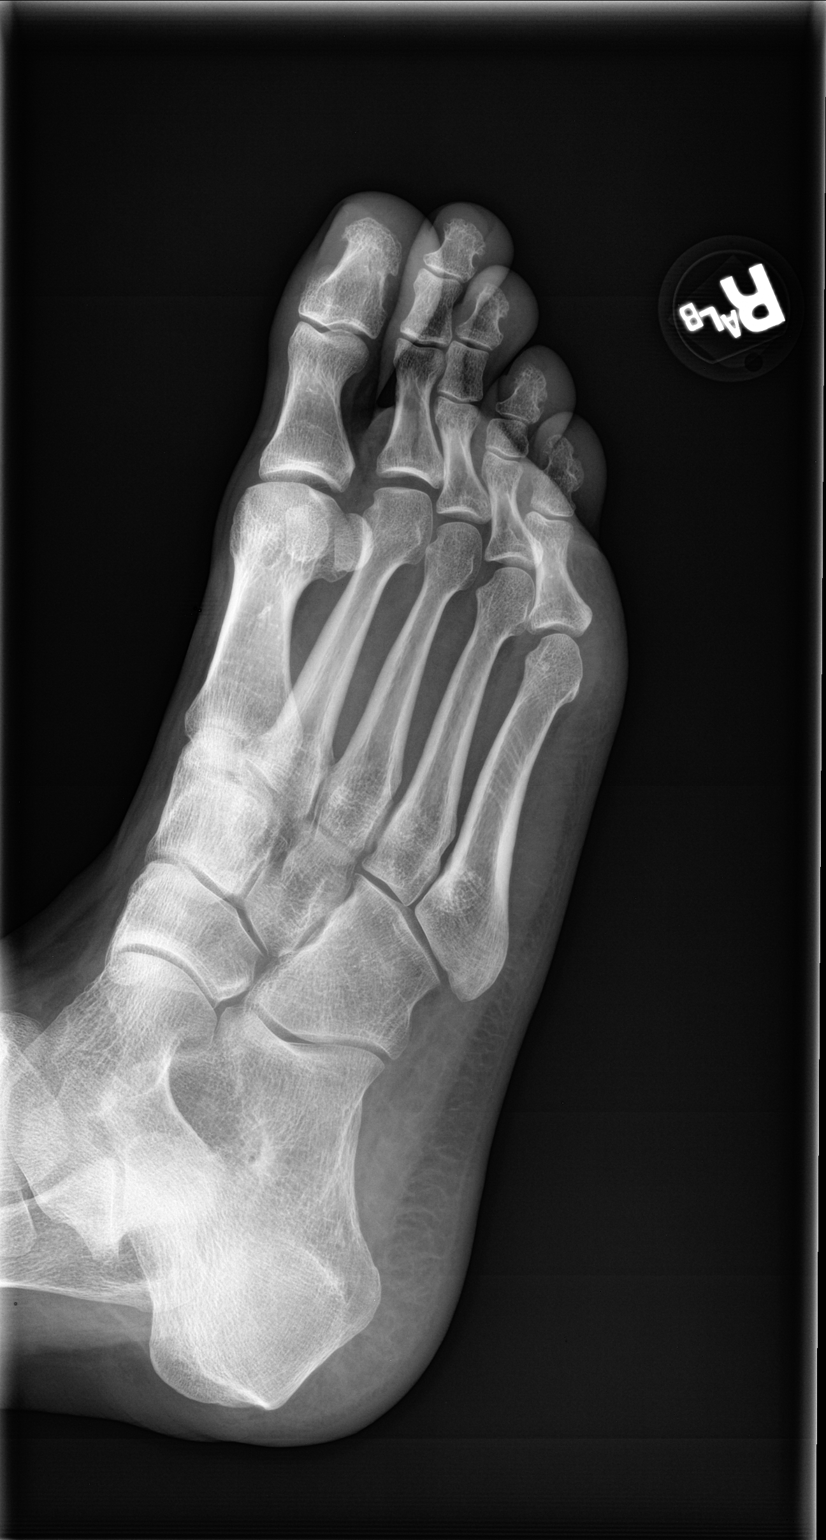

[foot lat]
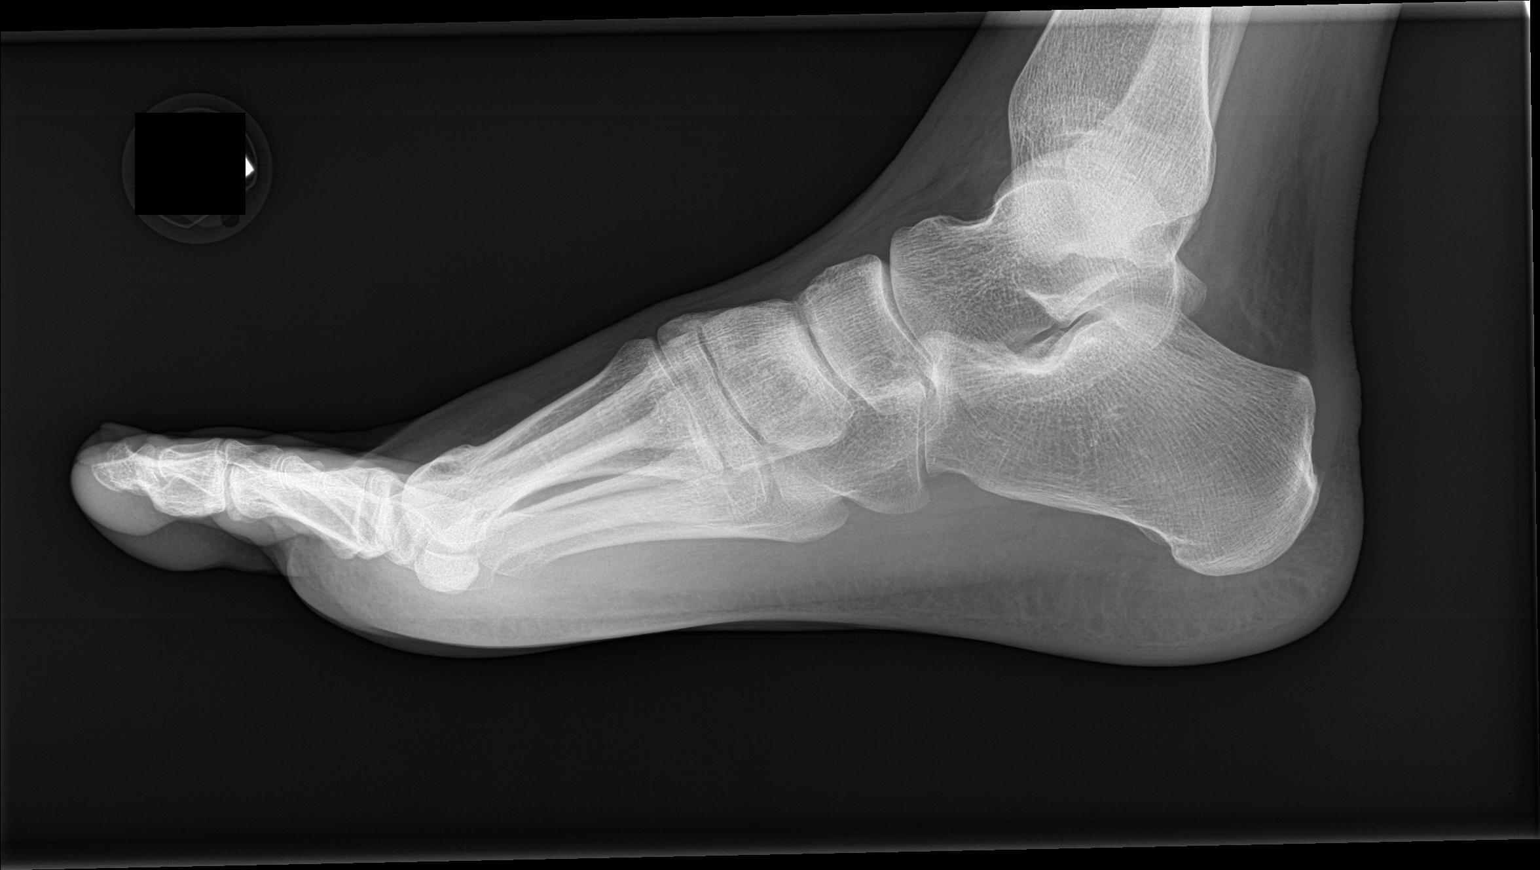

[3 of 3 positions shown; findings below may reference images not displayed]

FINDINGS: There is no evidence of fracture or dislocation. There is no
evidence of arthropathy or other focal bone abnormality. Soft
tissues are unremarkable.
IMPRESSION: Negative.
